# Patient Record
Sex: Female | Born: 1970 | Race: White | Hispanic: No | Marital: Married | Smoking: Never smoker
Health system: Southern US, Community
[De-identification: ages and names within clinical notes are randomized; demographics above are authoritative.]

---

## 1999-04-10 ENCOUNTER — Other Ambulatory Visit: Admission: RE | Admit: 1999-04-10 | Discharge: 1999-04-10 | Payer: Self-pay | Admitting: Obstetrics & Gynecology

## 2000-04-07 ENCOUNTER — Other Ambulatory Visit: Admission: RE | Admit: 2000-04-07 | Discharge: 2000-04-07 | Payer: Self-pay | Admitting: Gynecology

## 2001-04-13 ENCOUNTER — Other Ambulatory Visit: Admission: RE | Admit: 2001-04-13 | Discharge: 2001-04-13 | Payer: Self-pay | Admitting: Gynecology

## 2002-04-22 ENCOUNTER — Other Ambulatory Visit: Admission: RE | Admit: 2002-04-22 | Discharge: 2002-04-22 | Payer: Self-pay | Admitting: Gynecology

## 2002-05-19 ENCOUNTER — Other Ambulatory Visit: Admission: RE | Admit: 2002-05-19 | Discharge: 2002-05-19 | Payer: Self-pay | Admitting: Obstetrics and Gynecology

## 2002-12-10 ENCOUNTER — Inpatient Hospital Stay (HOSPITAL_COMMUNITY): Admission: AD | Admit: 2002-12-10 | Discharge: 2002-12-10 | Payer: Self-pay | Admitting: Obstetrics and Gynecology

## 2002-12-12 ENCOUNTER — Inpatient Hospital Stay (HOSPITAL_COMMUNITY): Admission: AD | Admit: 2002-12-12 | Discharge: 2002-12-15 | Payer: Self-pay | Admitting: *Deleted

## 2003-01-26 ENCOUNTER — Other Ambulatory Visit: Admission: RE | Admit: 2003-01-26 | Discharge: 2003-01-26 | Payer: Self-pay | Admitting: Obstetrics and Gynecology

## 2004-02-14 ENCOUNTER — Other Ambulatory Visit: Admission: RE | Admit: 2004-02-14 | Discharge: 2004-02-14 | Payer: Self-pay | Admitting: Gynecology

## 2005-02-25 ENCOUNTER — Other Ambulatory Visit: Admission: RE | Admit: 2005-02-25 | Discharge: 2005-02-25 | Payer: Self-pay | Admitting: Gynecology

## 2006-03-13 ENCOUNTER — Other Ambulatory Visit: Admission: RE | Admit: 2006-03-13 | Discharge: 2006-03-13 | Payer: Self-pay | Admitting: Gynecology

## 2008-04-14 ENCOUNTER — Encounter: Admission: RE | Admit: 2008-04-14 | Discharge: 2008-04-14 | Payer: Self-pay | Admitting: Gynecology

## 2010-06-01 NOTE — Discharge Summary (Signed)
Kaitlin Vaughn, Kaitlin Vaughn                            ACCOUNT NO.:  [id number]   MEDICAL RECORD NO.:  [id number]                   PATIENT TYPE:  INP   LOCATION:  9106                                 FACILITY:  [facility]   PHYSICIAN:  Duke Salvia. Marcelle Overlie, M.D.            DATE OF BIRTH:  [birth year]   DATE OF ADMISSION:  12/12/2002  DATE OF DISCHARGE:  12/15/2002                                 DISCHARGE SUMMARY   ADMISSION DIAGNOSES:  1. Intrauterine pregnancy at 39-4/7 weeks estimated gestational age.  2. Spontaneous onset of labor.   DISCHARGE DIAGNOSES:  1. Status post low transverse cesarean section secondary to fetal     intolerance to labor.  2. Viable female infant.   PROCEDURE:  Primary low transverse cesarean section.   REASON FOR ADMISSION:  Please see written H&P.   HOSPITAL COURSE:  The patient was a 40 year old married female primigravida  who was admitted to [facility] with spontaneous onset of  labor.  Fetal heart tones were reassuring at 150 to 160s.  Cervix was noted  to be dilated to 4 cm.  The patient was given epidural anesthesia for pain  relief.  Labor continued.  Some question related to fetal deceleration.  Artificial rupture of membranes was performed revealing light meconium  stained fluid.  Scalp electrode was placed and decrease in fetal variability  was noted.  Scalp stimulation was performed and good beat to beat  variability was noted.  Fetus was monitored very closely and after several  more hours of labor with cervix now dilated to 5 cm, 90% effaced, fetal  heart tones again revealed decreased beat to beat variability with some  variable deceleration.  Decision was then made to proceed with a primary low  transverse cesarean section.  The patient was then transferred to the  operating room where epidural was dosed to an adequate surgical level.  A  low transverse incision was made with delivery of a viable female infant  weighing 9 pounds 0 ounces  with Apgars of 8 at one minute and 9 at five  minutes.  The patient did tolerate the procedure well and was taken to the  recovery room in stable condition.   On postoperative day #1, vital signs were stable.  She was afebrile.  Abdomen was soft with good return of bowel function.  Abdominal dressing was  noted to be clean, dry and intact.  Fundus was firm and nontender.  Labs  revealed hemoglobin of 12.2, wbc count of 17.1, platelet count 237,000.   On postoperative day #2, the patient was without complaints.  Vital signs  remained stable.  She was afebrile.  Abdomen soft.  Abdominal dressing had  been removed revealing incision that was clean, dry and intact.  The patient  was ambulating well and tolerating a regular diet without complaints of  nausea and vomiting.   On postoperative day #3, the patient  was doing well.  She was ambulating  without assistance.  Fundus was firm and nontender.  Incision was clean, dry  and intact.  Staples removed and the patient was discharged home.   CONDITION ON DISCHARGE:  Good.   DIET:  Regular as tolerated.   ACTIVITY:  No heavy lifting, no driving x2 weeks, no vaginal entry.   FOLLOW UP:  The patient is to follow up in the office in one to two weeks  for an incision check.  She is to call for temperature greater than 100  degrees, persistent nausea and vomiting, heavy vaginal bleeding, and/or  redness or drainage from the incisional site.   DISCHARGE MEDICATIONS:  1. Percocet 5/325 #30 one p.o. q.4-6h. p.r.n.  2. Motrin 600 mg q.6h. p.r.n.  3. Prenatal vitamins one p.o. daily.  4. Colace one p.o. daily p.r.n.     Julio Sicks, N.P.                        Richard M. Marcelle Overlie, M.D.    CC/MEDQ  D:  02/16/2003  T:  02/16/2003  Job:  [id number]

## 2010-06-01 NOTE — Op Note (Signed)
Kaitlin Vaughn, Kaitlin Vaughn                            ACCOUNT NO.:  [id number]   MEDICAL RECORD NO.:  [id number]                   PATIENT TYPE:  INP   LOCATION:  9106                                 FACILITY:  [facility]   PHYSICIAN:  Tracie Harrier, M.D.              DATE OF BIRTH:  04/08/1970   DATE OF PROCEDURE:  12/14/2002  DATE OF DISCHARGE:                                 OPERATIVE REPORT   PREOPERATIVE DIAGNOSES:  1. Intrauterine pregnancy at term.  2. Fetal intolerance to labor.   POSTOPERATIVE DIAGNOSES:  1. Intrauterine pregnancy at term.  2. Fetal intolerance to labor.   PROCEDURE:  Primary low transverse cesarean section.   SURGEON:  Tracie Harrier, M.D.   ANESTHESIA:  Epidural.   ESTIMATED BLOOD LOSS:  500 mL.   COMPLICATIONS:  None.   FINDINGS:  At [id number] through a low transverse uterine incision, a viable female  infant was delivered from the vertex presentation.  Apgars were 8 and 9.  The baby was a female weighing 9 pounds.   The pelvis was visualized at the time of surgery and noted to be normal.   DESCRIPTION OF PROCEDURE:  The patient was taken to the operating room where  adequate level of epidural anesthetic was administered.  The patient was  placed on the operating table in the left lateral tilt position, the abdomen  was prepped and draped in the usual sterile fashion with Betadine and  sterile drapes.  A Foley catheter had previously been inserted.  The abdomen  was entered through a Pfannenstiel incision and carried down sharply in the  usual fashion.  The peritoneum was atraumatically entered.  The  vesicouterine peritoneum overlying the lower uterine segment was incised and  a bladder flap was bluntly and sharply created over the lower uterine  segment.  A bladder blade was then placed behind the bladder to insure its  protection during the procedure.  The uterus was then entered through a low  transverse incision and carried out laterally using the  operator's fingers.  The membranes were entered with clear fluid noted.  The vertex was elevated  into the incision and delivered promptly and easily at 0839.  There was  light meconium noted which was bulb suctioned thoroughly.  There was no  particulate thick meconium noted.  The cord was doubly clamped and cut and  the baby was handed promptly to the pediatricians.  The baby did well.  Apgars were 8 and 9 at one and five minutes, respectively.   The baby was a female weighing 9 pounds 0 ounces.  The placenta was then  manually extracted intact with three vessel cord without difficulty.  The  interior of the uterus was wiped clean with a wet sponge.  The uterus was  then closed in a two layer fashion.  The first layer, a running interlocking  suture of #1 Vicryl. A  second imbricating suture was placed across the  primary suture line with a running stitch of #1 Vicryl as well.  Good  hemostasis was noted.  Attention was then turned to closure.  The pelvis was  thoroughly irrigated and noted to be hemostatic.  The pelvis was also  normal.   The rectus muscle and anterior peritoneum was closed with a running suture  of #1 Vicryl.  The subfascial areas were hemostatic.  The fascia was closed  with a running suture of 0 PDS.  Subcutaneous tissue was irrigated and made  hemostatic using the Bovie cautery.  The skin reapproximated with staples  and a sterile dressing applied.   Final sponge, needle and instrument counts were correct x3.  There were no  perioperative complications.  The patient did receive an IV antibiotic after  cord clamp.                                               Tracie Harrier, M.D.    REG/MEDQ  D:  12/14/2002  T:  12/14/2002  Job:  [id number]

## 2012-04-03 ENCOUNTER — Other Ambulatory Visit: Payer: Self-pay

## 2012-04-03 DIAGNOSIS — Z1231 Encounter for screening mammogram for malignant neoplasm of breast: Secondary | ICD-10-CM

## 2012-05-11 ENCOUNTER — Ambulatory Visit
Admission: RE | Admit: 2012-05-11 | Discharge: 2012-05-11 | Disposition: A | Discharge status: Home / Self Care | Payer: BC Managed Care – PPO | Source: Ambulatory Visit

## 2012-05-11 DIAGNOSIS — Z1231 Encounter for screening mammogram for malignant neoplasm of breast: Secondary | ICD-10-CM

## 2012-05-12 ENCOUNTER — Other Ambulatory Visit: Payer: Self-pay | Admitting: Obstetrics and Gynecology

## 2012-05-12 DIAGNOSIS — R928 Other abnormal and inconclusive findings on diagnostic imaging of breast: Secondary | ICD-10-CM

## 2012-05-22 ENCOUNTER — Ambulatory Visit
Admission: RE | Admit: 2012-05-22 | Discharge: 2012-05-22 | Disposition: A | Discharge status: Home / Self Care | Payer: BC Managed Care – PPO | Source: Ambulatory Visit | Attending: Obstetrics and Gynecology | Admitting: Obstetrics and Gynecology

## 2012-05-22 DIAGNOSIS — R928 Other abnormal and inconclusive findings on diagnostic imaging of breast: Secondary | ICD-10-CM

## 2012-10-27 ENCOUNTER — Other Ambulatory Visit: Payer: Self-pay | Admitting: Obstetrics and Gynecology

## 2012-10-27 DIAGNOSIS — N6310 Unspecified lump in the right breast, unspecified quadrant: Secondary | ICD-10-CM

## 2012-11-23 ENCOUNTER — Ambulatory Visit
Admission: RE | Admit: 2012-11-23 | Discharge: 2012-11-23 | Disposition: A | Discharge status: Home / Self Care | Payer: Self-pay | Source: Ambulatory Visit | Attending: Obstetrics and Gynecology | Admitting: Obstetrics and Gynecology

## 2012-11-23 DIAGNOSIS — N6310 Unspecified lump in the right breast, unspecified quadrant: Secondary | ICD-10-CM

## 2013-09-02 ENCOUNTER — Other Ambulatory Visit: Payer: Self-pay | Admitting: Obstetrics and Gynecology

## 2013-09-02 DIAGNOSIS — N6310 Unspecified lump in the right breast, unspecified quadrant: Secondary | ICD-10-CM

## 2013-09-08 ENCOUNTER — Ambulatory Visit
Admission: RE | Admit: 2013-09-08 | Discharge: 2013-09-08 | Disposition: A | Discharge status: Home / Self Care | Payer: Commercial Indemnity | Source: Ambulatory Visit | Attending: Obstetrics and Gynecology | Admitting: Obstetrics and Gynecology

## 2013-09-08 DIAGNOSIS — N6310 Unspecified lump in the right breast, unspecified quadrant: Secondary | ICD-10-CM

## 2020-01-05 ENCOUNTER — Encounter (HOSPITAL_COMMUNITY)
Admission: EM | Disposition: A | Discharge status: Home / Self Care | Payer: Self-pay | Source: Home / Self Care | Attending: Emergency Medicine

## 2020-01-05 ENCOUNTER — Other Ambulatory Visit: Payer: Self-pay

## 2020-01-05 ENCOUNTER — Emergency Department (HOSPITAL_COMMUNITY): Payer: Managed Care, Other (non HMO) | Admitting: Anesthesiology

## 2020-01-05 ENCOUNTER — Ambulatory Visit (HOSPITAL_COMMUNITY)
Admission: EM | Admit: 2020-01-05 | Discharge: 2020-01-05 | Disposition: A | Discharge status: Home / Self Care | Payer: Managed Care, Other (non HMO) | Attending: Emergency Medicine | Admitting: Emergency Medicine

## 2020-01-05 ENCOUNTER — Emergency Department (HOSPITAL_COMMUNITY): Payer: Managed Care, Other (non HMO)

## 2020-01-05 ENCOUNTER — Encounter (HOSPITAL_COMMUNITY): Payer: Self-pay | Admitting: Emergency Medicine

## 2020-01-05 DIAGNOSIS — T185XXA Foreign body in anus and rectum, initial encounter: Secondary | ICD-10-CM

## 2020-01-05 DIAGNOSIS — X58XXXA Exposure to other specified factors, initial encounter: Secondary | ICD-10-CM | POA: Insufficient documentation

## 2020-01-05 DIAGNOSIS — Z20822 Contact with and (suspected) exposure to covid-19: Secondary | ICD-10-CM | POA: Insufficient documentation

## 2020-01-05 DIAGNOSIS — Z79899 Other long term (current) drug therapy: Secondary | ICD-10-CM | POA: Insufficient documentation

## 2020-01-05 LAB — URINALYSIS, ROUTINE W REFLEX MICROSCOPIC
Bilirubin Urine: NEGATIVE
Glucose, UA: NEGATIVE mg/dL
Hgb urine dipstick: NEGATIVE
Ketones, ur: 20 mg/dL — AB
Leukocytes,Ua: NEGATIVE
Nitrite: NEGATIVE
Protein, ur: NEGATIVE mg/dL
Specific Gravity, Urine: 1.023 (ref 1.005–1.030)
pH: 6 (ref 5.0–8.0)

## 2020-01-05 LAB — COMPREHENSIVE METABOLIC PANEL
ALT: 15 U/L (ref 0–44)
AST: 16 U/L (ref 15–41)
Albumin: 4.3 g/dL (ref 3.5–5.0)
Alkaline Phosphatase: 59 U/L (ref 38–126)
Anion gap: 10 (ref 5–15)
BUN: 15 mg/dL (ref 6–20)
CO2: 23 mmol/L (ref 22–32)
Calcium: 8.7 mg/dL — ABNORMAL LOW (ref 8.9–10.3)
Chloride: 103 mmol/L (ref 98–111)
Creatinine, Ser: 0.73 mg/dL (ref 0.44–1.00)
GFR, Estimated: >60 mL/min (ref >60)
Glucose, Bld: 99 mg/dL (ref 70–99)
Potassium: 3.5 mmol/L (ref 3.5–5.1)
Sodium: 136 mmol/L (ref 135–145)
Total Bilirubin: 1 mg/dL (ref 0.3–1.2)
Total Protein: 7.3 g/dL (ref 6.5–8.1)

## 2020-01-05 LAB — CBC WITH DIFFERENTIAL/PLATELET
Abs Immature Granulocytes: 0.03 10*3/uL (ref 0.00–0.07)
Basophils Absolute: 0.1 10*3/uL (ref 0.0–0.1)
Basophils Relative: 1 %
Eosinophils Absolute: 0.1 10*3/uL (ref 0.0–0.5)
Eosinophils Relative: 1 %
HCT: 39.3 % (ref 36.0–46.0)
Hemoglobin: 12.6 g/dL (ref 12.0–15.0)
Immature Granulocytes: 0 %
Lymphocytes Relative: 17 %
Lymphs Abs: 1.7 10*3/uL (ref 0.7–4.0)
MCH: 32.1 pg (ref 26.0–34.0)
MCHC: 32.1 g/dL (ref 30.0–36.0)
MCV: 100.3 fL — ABNORMAL HIGH (ref 80.0–100.0)
Monocytes Absolute: 0.5 10*3/uL (ref 0.1–1.0)
Monocytes Relative: 5 %
Neutro Abs: 7.4 10*3/uL (ref 1.7–7.7)
Neutrophils Relative %: 76 %
Platelets: 339 10*3/uL (ref 150–400)
RBC: 3.92 MIL/uL (ref 3.87–5.11)
RDW: 13.3 % (ref 11.5–15.5)
WBC: 9.7 10*3/uL (ref 4.0–10.5)
nRBC: 0 % (ref 0.0–0.2)

## 2020-01-05 LAB — RESP PANEL BY RT-PCR (FLU A&B, COVID) ARPGX2
Influenza A by PCR: NEGATIVE
Influenza B by PCR: NEGATIVE
SARS Coronavirus 2 by RT PCR: NEGATIVE

## 2020-01-05 LAB — PROTIME-INR
INR: 1 (ref 0.8–1.2)
Prothrombin Time: 12.7 seconds (ref 11.4–15.2)

## 2020-01-05 LAB — POC URINE PREG, ED: Preg Test, Ur: NEGATIVE

## 2020-01-05 SURGERY — EXAM UNDER ANESTHESIA
Anesthesia: General | Site: Rectum

## 2020-01-05 MED ORDER — LIDOCAINE 2% (20 MG/ML) 5 ML SYRINGE
INTRAMUSCULAR | Status: DC | PRN
  Administered 2020-01-05: 40 mg via INTRAVENOUS
  Administered 2020-01-05: 60 mg via INTRAVENOUS

## 2020-01-05 MED ORDER — OXYCODONE HCL 5 MG PO TABS: 5.0000 mg | ORAL_TABLET | Freq: Once | ORAL | Status: DC | PRN

## 2020-01-05 MED ORDER — SCOPOLAMINE 1 MG/3DAYS TD PT72
MEDICATED_PATCH | TRANSDERMAL | Status: AC
  Filled 2020-01-05: qty 1

## 2020-01-05 MED ORDER — 0.9 % SODIUM CHLORIDE (POUR BTL) OPTIME
TOPICAL | Status: DC | PRN
  Administered 2020-01-05: 1000 mL

## 2020-01-05 MED ORDER — MIDAZOLAM HCL 2 MG/2ML IJ SOLN
INTRAMUSCULAR | Status: DC | PRN
  Administered 2020-01-05: 2 mg via INTRAVENOUS

## 2020-01-05 MED ORDER — FENTANYL CITRATE (PF) 100 MCG/2ML IJ SOLN
25.0000 ug | INTRAMUSCULAR | Status: DC | PRN
Start: 2020-01-05 — End: 2020-01-06

## 2020-01-05 MED ORDER — FENTANYL CITRATE (PF) 250 MCG/5ML IJ SOLN
INTRAMUSCULAR | Status: AC
Start: 2020-01-05 — End: ?
  Filled 2020-01-05: qty 5

## 2020-01-05 MED ORDER — PROPOFOL 10 MG/ML IV BOLUS
INTRAVENOUS | Status: DC | PRN
  Administered 2020-01-05: 20 mg via INTRAVENOUS
  Administered 2020-01-05: 160 mg via INTRAVENOUS

## 2020-01-05 MED ORDER — ACETAMINOPHEN 10 MG/ML IV SOLN: 1000.0000 mg | Freq: Once | INTRAVENOUS | Status: DC | PRN

## 2020-01-05 MED ORDER — DEXAMETHASONE SODIUM PHOSPHATE 10 MG/ML IJ SOLN
INTRAMUSCULAR | Status: DC | PRN
  Administered 2020-01-05: 10 mg via INTRAVENOUS

## 2020-01-05 MED ORDER — MEPERIDINE HCL 50 MG/ML IJ SOLN: 6.2500 mg | INTRAMUSCULAR | Status: DC | PRN

## 2020-01-05 MED ORDER — OXYCODONE HCL 5 MG/5ML PO SOLN: 5.0000 mg | Freq: Once | ORAL | Status: DC | PRN

## 2020-01-05 MED ORDER — LACTATED RINGERS IV SOLN: INTRAVENOUS | Status: DC

## 2020-01-05 MED ORDER — SCOPOLAMINE 1 MG/3DAYS TD PT72
MEDICATED_PATCH | TRANSDERMAL | Status: DC | PRN
  Administered 2020-01-05: 1 via TRANSDERMAL

## 2020-01-05 MED ORDER — ONDANSETRON HCL 4 MG/2ML IJ SOLN
INTRAMUSCULAR | Status: DC | PRN
  Administered 2020-01-05: 4 mg via INTRAVENOUS

## 2020-01-05 MED ORDER — AMISULPRIDE (ANTIEMETIC) 5 MG/2ML IV SOLN: 10.0000 mg | Freq: Once | INTRAVENOUS | Status: DC | PRN

## 2020-01-05 MED ORDER — SUCCINYLCHOLINE CHLORIDE 200 MG/10ML IV SOSY
PREFILLED_SYRINGE | INTRAVENOUS | Status: DC | PRN
  Administered 2020-01-05: 120 mg via INTRAVENOUS

## 2020-01-05 MED ORDER — FENTANYL CITRATE (PF) 250 MCG/5ML IJ SOLN
INTRAMUSCULAR | Status: DC | PRN
  Administered 2020-01-05 (×2): 50 ug via INTRAVENOUS

## 2020-01-05 MED ORDER — ACETAMINOPHEN 325 MG PO TABS: 325.0000 mg | ORAL_TABLET | Freq: Once | ORAL | Status: DC | PRN

## 2020-01-05 MED ORDER — MIDAZOLAM HCL 2 MG/2ML IJ SOLN
INTRAMUSCULAR | Status: AC
  Filled 2020-01-05: qty 2

## 2020-01-05 MED ORDER — LACTATED RINGERS IV SOLN: INTRAVENOUS | Status: DC | PRN

## 2020-01-05 MED ORDER — ACETAMINOPHEN 160 MG/5ML PO SOLN
325.0000 mg | Freq: Once | ORAL | Status: DC | PRN
Start: 2020-01-05 — End: 2020-01-06

## 2020-01-05 MED ORDER — PROPOFOL 10 MG/ML IV BOLUS
INTRAVENOUS | Status: AC
  Filled 2020-01-05: qty 20

## 2020-01-05 SURGICAL SUPPLY — 22 items
BLADE SURG 15 STRL LF DISP TIS (BLADE) IMPLANT
BLADE SURG 15 STRL SS (BLADE)
CHLORAPREP W/TINT 26 (MISCELLANEOUS) IMPLANT
COVER SURGICAL LIGHT HANDLE (MISCELLANEOUS) ×2 IMPLANT
COVER WAND RF STERILE (DRAPES) IMPLANT
ELECT REM PT RETURN 15FT ADLT (MISCELLANEOUS) IMPLANT
GAUZE 4X4 16PLY RFD (DISPOSABLE) IMPLANT
GLOVE BIOGEL PI IND STRL 7.0 (GLOVE) ×1 IMPLANT
GLOVE BIOGEL PI INDICATOR 7.0 (GLOVE) ×1
GLOVE SURG ORTHO 8.0 STRL STRW (GLOVE) ×2 IMPLANT
GOWN STRL REUS W/TWL LRG LVL3 (GOWN DISPOSABLE) ×2 IMPLANT
GOWN STRL REUS W/TWL XL LVL3 (GOWN DISPOSABLE) ×4 IMPLANT
KIT BASIN OR (CUSTOM PROCEDURE TRAY) IMPLANT
KIT TURNOVER KIT A (KITS) IMPLANT
NEEDLE HYPO 22GX1.5 SAFETY (NEEDLE) IMPLANT
PACK LITHOTOMY IV (CUSTOM PROCEDURE TRAY) ×2 IMPLANT
PENCIL SMOKE EVACUATOR (MISCELLANEOUS) IMPLANT
SURGILUBE 2OZ TUBE FLIPTOP (MISCELLANEOUS) ×2 IMPLANT
SUT CHROMIC 2 0 SH (SUTURE) IMPLANT
SUT CHROMIC 3 0 SH 27 (SUTURE) IMPLANT
SYR CONTROL 10ML LL (SYRINGE) IMPLANT
TOWEL OR 17X26 10 PK STRL BLUE (TOWEL DISPOSABLE) IMPLANT

## 2020-01-05 NOTE — ED Notes (Signed)
Assisted Dr Roslynn Amble while he performed rectal exam.

## 2020-01-05 NOTE — ED Notes (Signed)
Report received from [place], [place].  Assumed role as primary nurse at this time.  Pt resting quietly in bed at this time.  NADN.  Will continue to monitor.

## 2020-01-05 NOTE — Anesthesia Procedure Notes (Signed)
Procedure Name: Intubation Date/Time: 01/05/2020 8:34 PM Performed by: Cynda Familia, CRNA Pre-anesthesia Checklist: Patient identified, Emergency Drugs available, Suction available and Patient being monitored Patient Re-evaluated:Patient Re-evaluated prior to induction Oxygen Delivery Method: Circle System Utilized Preoxygenation: Pre-oxygenation with 100% oxygen Induction Type: IV induction, Rapid sequence and Cricoid Pressure applied Ventilation: Mask ventilation without difficulty Laryngoscope Size: Miller and 2 Grade View: Grade I Tube type: Oral Tube size: 7.0 mm Number of attempts: 1 Airway Equipment and Method: Stylet and Oral airway Placement Confirmation: ETT inserted through vocal cords under direct vision,  positive ETCO2 and breath sounds checked- equal and bilateral Secured at: 21 cm Tube secured with: Tape Dental Injury: Teeth and Oropharynx as per pre-operative assessment  Comments: Smooth Iv induction [name]-- intubation AM CRNA atraumatic-- teeth and mouth as preop -- slight chipping front teeth --unchanged-- bilat BS

## 2020-01-05 NOTE — Anesthesia Preprocedure Evaluation (Addendum)
Anesthesia Evaluation  Patient identified by MRN, date of birth, ID band Patient awake    Reviewed: Allergy & Precautions, NPO status , Patient's Chart, lab work & pertinent test results  Airway Mallampati: II  TM Distance: >3 FB Neck ROM: Full    Dental  (+) Teeth Intact, Dental Advisory Given, Chipped,    Pulmonary neg pulmonary ROS,    breath sounds clear to auscultation       Cardiovascular negative cardio ROS   Rhythm:Regular Rate:Normal     Neuro/Psych negative neurological ROS  negative psych ROS   GI/Hepatic negative GI ROS, Neg liver ROS,   Endo/Other  negative endocrine ROS  Renal/GU negative Renal ROS     Musculoskeletal   Abdominal Normal abdominal exam  (+)   Peds  Hematology negative hematology ROS (+)   Anesthesia Other Findings   Reproductive/Obstetrics                            Anesthesia Physical Anesthesia Plan  ASA: I  Anesthesia Plan: General   Post-op Pain Management:    Induction: Intravenous, Rapid sequence and Cricoid pressure planned  PONV Risk Score and Plan: 4 or greater and Ondansetron, Dexamethasone, Midazolam and Scopolamine patch - Pre-op  Airway Management Planned: Oral ETT  Additional Equipment: None  Intra-op Plan:   Post-operative Plan: Extubation in OR  Informed Consent: I have reviewed the patients History and Physical, chart, labs and discussed the procedure including the risks, benefits and alternatives for the proposed anesthesia with the patient or authorized representative who has indicated his/her understanding and acceptance.       Plan Discussed with: CRNA  Anesthesia Plan Comments:        Anesthesia Quick Evaluation

## 2020-01-05 NOTE — Anesthesia Procedure Notes (Signed)
Date/Time: 01/05/2020 8:51 PM Performed by: Cynda Familia, CRNA Oxygen Delivery Method: Simple face mask Placement Confirmation: positive ETCO2 and breath sounds checked- equal and bilateral Dental Injury: Teeth and Oropharynx as per pre-operative assessment

## 2020-01-05 NOTE — ED Provider Notes (Signed)
[facility] DEPT Provider Note   CSN: [id number] Arrival date & time: 01/05/20  1546     History No chief complaint on file.   Kaitlin Vaughn is a 49 y.o. female.  Patient reports that she placed vibrator per rectum at approximately 12:30 PM this afternoon.  Lost vibrator, unable to retrieve.  Reports this is never happened before.  Not currently having any pain.  Denies any medical problems.  HPI     History reviewed. No pertinent past medical history.  There are no problems to display for this patient.   Past Surgical History:  Procedure Laterality Date  . CESAREAN SECTION       OB History   No obstetric history on file.     History reviewed. No pertinent family history.  Social History   Tobacco Use  . Smoking status: Never Smoker  . Smokeless tobacco: Never Used  Substance Use Topics  . Alcohol use: Yes    Alcohol/week: 2.0 standard drinks    Types: 2 Glasses of wine per week  . Drug use: Not Currently    Home Medications Prior to Admission medications   Medication Sig Start Date End Date Taking? Authorizing Provider  citalopram (CELEXA) 40 MG tablet Take 40 mg by mouth daily. 10/22/19   [provider]  traZODone (DESYREL) 50 MG tablet Take 50 mg by mouth at bedtime. 11/22/19   [provider]    Allergies    Patient has no known allergies.  Review of Systems   Review of Systems  Constitutional: Negative for chills and fever.  HENT: Negative for ear pain and sore throat.   Eyes: Negative for pain and visual disturbance.  Respiratory: Negative for cough and shortness of breath.   Cardiovascular: Negative for chest pain and palpitations.  Gastrointestinal: Negative for abdominal pain and vomiting.       Foreign body  Genitourinary: Negative for dysuria and hematuria.  Musculoskeletal: Negative for arthralgias and back pain.  Skin: Negative for color change and rash.  Neurological: Negative for  seizures and syncope.  All other systems reviewed and are negative.   Physical Exam Updated Vital Signs BP (!) 152/98   Pulse (!) 104   Temp 97.6 F (36.4 C) (Oral)   Resp (!) 22   LMP 12/18/2019   SpO2 96%   Physical Exam Vitals and nursing note reviewed.  Constitutional:      General: She is not in acute distress.    Appearance: She is well-developed and well-nourished.  HENT:     Head: Normocephalic and atraumatic.  Eyes:     Conjunctiva/sclera: Conjunctivae normal.  Cardiovascular:     Rate and Rhythm: Normal rate.     Pulses: Normal pulses.  Pulmonary:     Effort: Pulmonary effort is normal. No respiratory distress.  Abdominal:     General: There is no distension.     Palpations: Abdomen is soft.     Tenderness: There is no abdominal tenderness. There is no guarding or rebound.  Genitourinary:    Comments: RN chaperone, normal-appearing anus, on digital rectal exam, able to palpate foreign body deep in rectum  Under anoscopy, unable to visualize foreign body Musculoskeletal:        General: No edema.     Cervical back: Neck supple.  Skin:    General: Skin is warm and dry.  Neurological:     Mental Status: She is alert.  Psychiatric:        Mood and  Affect: Mood and affect and mood normal.        Behavior: Behavior normal.     ED Results / Procedures / Treatments   Labs (all labs ordered are listed, but only abnormal results are displayed) Labs Reviewed  COMPREHENSIVE METABOLIC PANEL - Abnormal; Notable for the following components:      Result Value   Calcium 8.7 (*)    All other components within normal limits  CBC WITH DIFFERENTIAL/PLATELET - Abnormal; Notable for the following components:   MCV 100.3 (*)    All other components within normal limits  URINALYSIS, ROUTINE W REFLEX MICROSCOPIC - Abnormal; Notable for the following components:   Ketones, ur 20 (*)    All other components within normal limits  RESP PANEL BY RT-PCR (FLU A&B, COVID)  ARPGX2  PROTIME-INR  POC URINE PREG, ED    EKG None  Radiology DG Abd Acute W/Chest  Result Date: 01/05/2020 CLINICAL DATA:  49 year old female with foreign object in the rectum. EXAM: DG ABDOMEN ACUTE WITH 1 VIEW CHEST COMPARISON:  None. FINDINGS: The lungs are clear. There is no pleural effusion or pneumothorax. The cardiac silhouette is within limits. Radiopaque foreign object noted over the rectum measuring approximately 7.5 cm in length. There is no bowel dilatation or evidence of obstruction. No free air or radiopaque calculi. The osseous structures and soft tissues are unremarkable. IMPRESSION: 1. Radiopaque foreign object over the rectum. 2. No acute cardiopulmonary process. Electronically Signed   By: Anner Crete M.D.   On: 01/05/2020 18:38    Procedures Procedures (including critical care time)  Medications Ordered in ED Medications - No data to display  ED Course  I have reviewed the triage vital signs and the nursing notes.  Pertinent labs & imaging results that were available during my care of the patient were reviewed by me and considered in my medical decision making (see chart for details).    MDM Rules/Calculators/A&P                         49 year old lady presents to ER with concern for foreign body of rectum.  Plain films confirm radiopaque foreign object over rectum 7.5 cm in length.  On digital rectal exam I was able to palpate at very tip of my finger the foreign body.  Subsequently used anoscope to attempt to directly visualize so that this could be removed with forceps.  Unfortunately I was unable to directly visualize foreign body.  Consequently, called Dr. Harlow Asa.  He will take patient to OR to perform foreign body removal under general anesthesia.  Final Clinical Impression(s) / ED Diagnoses Final diagnoses:  Foreign body in anus, initial encounter    Rx / DC Orders ED Discharge Orders    None       Lucrezia Starch, MD 01/05/20 1949

## 2020-01-05 NOTE — Transfer of Care (Signed)
Immediate Anesthesia Transfer of Care Note  Patient: Kaitlin Vaughn  Procedure(s) Performed: EXAM UNDER ANESTHESIA,REMOVAL OF FOREIGN BODY (N/A )  Patient Location: PACU  Anesthesia Type:General  Level of Consciousness: awake and alert   Airway & Oxygen Therapy: Patient Spontanous Breathing and Patient connected to face mask oxygen  Post-op Assessment: Report given to RN and Post -op Vital signs reviewed and stable  Post vital signs: Reviewed and stable  Last Vitals:  Vitals Value Taken Time  BP    Temp    Pulse 110 01/05/20 2058  Resp    SpO2 100 % 01/05/20 2058  Vitals shown include unvalidated device data.  Last Pain:  Vitals:   01/05/20 1759  TempSrc: Oral  PainSc:          Complications: No complications documented.

## 2020-01-05 NOTE — ED Triage Notes (Signed)
Patient states she has a vibrator stuck in her rectum since around 12:30 today, about as long and thick as her index finger. Last meal was 01/05/20 around 8pm. States it is no longer vibrating.

## 2020-01-05 NOTE — ED Notes (Signed)
Patient transported to X-ray 

## 2020-01-05 NOTE — ED Notes (Signed)
Report given to OR.  Pt SBAR information covered and IV started in Emergency Department.  Pt anxious about procedure.  NADN.

## 2020-01-05 NOTE — H&P (Signed)
Kaitlin Vaughn is an 49 y.o. female.    General Surgery [facility]  Chief Complaint: foreign body in rectum  HPI: Patient is a 49 year old female who inserted a vibrator into the rectum approximately noon today.  Unfortunately this was unable to be retrieved.  She has seen a small amount of bleeding with bowel movement.  She presented to the emergency department for evaluation.  The device was palpable on rectal examination but could not be removed successfully.  General surgery was called for management.  Patient has had previous cesarean section.  No prior abdominal surgery otherwise.  No prior anorectal surgery.  History reviewed. No pertinent past medical history.  Past Surgical History:  Procedure Laterality Date  . CESAREAN SECTION      History reviewed. No pertinent family history. Social History:  reports that she has never smoked. She has never used smokeless tobacco. She reports current alcohol use of about 2.0 standard drinks of alcohol per week. She reports previous drug use.  Allergies: No Known Allergies  Medications Prior to Admission  Medication Sig Dispense Refill  . citalopram (CELEXA) 40 MG tablet Take 40 mg by mouth daily.    . traZODone (DESYREL) 50 MG tablet Take 50 mg by mouth at bedtime.      Results for orders placed or performed during the hospital encounter of 01/05/20 (from the past 48 hour(s))  Comprehensive metabolic panel     Status: Abnormal   Collection Time: 01/05/20  4:20 PM  Result Value Ref Range   Sodium 136 135 - 145 mmol/L   Potassium 3.5 3.5 - 5.1 mmol/L   Chloride 103 98 - 111 mmol/L   CO2 23 22 - 32 mmol/L   Glucose, Bld 99 70 - 99 mg/dL    Comment: Glucose reference range applies only to samples taken after fasting for at least 8 hours.   BUN 15 6 - 20 mg/dL   Creatinine, Ser 0.73 0.44 - 1.00 mg/dL   Calcium 8.7 (L) 8.9 - 10.3 mg/dL   Total Protein 7.3 6.5 - 8.1 g/dL   Albumin 4.3 3.5 - 5.0 g/dL   AST 16 15 -  41 U/L   ALT 15 0 - 44 U/L   Alkaline Phosphatase 59 38 - 126 U/L   Total Bilirubin 1.0 0.3 - 1.2 mg/dL   GFR, Estimated >60 >60 mL/min    Comment: (NOTE) Calculated using the CKD-EPI Creatinine Equation (2021)    Anion gap 10 5 - 15    Comment: Performed at [facility], [place] [address], [place], [place] [address]  CBC with Differential     Status: Abnormal   Collection Time: 01/05/20  4:20 PM  Result Value Ref Range   WBC 9.7 4.0 - 10.5 K/uL   RBC 3.92 3.87 - 5.11 MIL/uL   Hemoglobin 12.6 12.0 - 15.0 g/dL   HCT 39.3 36.0 - 46.0 %   MCV 100.3 (H) 80.0 - 100.0 fL   MCH 32.1 26.0 - 34.0 pg   MCHC 32.1 30.0 - 36.0 g/dL   RDW 13.3 11.5 - 15.5 %   Platelets 339 150 - 400 K/uL   nRBC 0.0 0.0 - 0.2 %   Neutrophils Relative % 76 %   Neutro Abs 7.4 1.7 - 7.7 K/uL   Lymphocytes Relative 17 %   Lymphs Abs 1.7 0.7 - 4.0 K/uL   Monocytes Relative 5 %   Monocytes Absolute 0.5 0.1 - 1.0 K/uL   Eosinophils Relative  1 %   Eosinophils Absolute 0.1 0.0 - 0.5 K/uL   Basophils Relative 1 %   Basophils Absolute 0.1 0.0 - 0.1 K/uL   Immature Granulocytes 0 %   Abs Immature Granulocytes 0.03 0.00 - 0.07 K/uL    Comment: Performed at [facility], [place] [address], [place], [place] [address]  Protime-INR     Status: None   Collection Time: 01/05/20  4:20 PM  Result Value Ref Range   Prothrombin Time 12.7 11.4 - 15.2 seconds   INR 1.0 0.8 - 1.2    Comment: (NOTE) INR goal varies based on device and disease states. Performed at [facility], [place] [address] [place], [place] [address]   Urinalysis, Routine w reflex microscopic     Status: Abnormal   Collection Time: 01/05/20  5:34 PM  Result Value Ref Range   Color, Urine YELLOW YELLOW   APPearance CLEAR CLEAR   Specific Gravity, Urine 1.023 1.005 - 1.030   pH 6.0 5.0 - 8.0   Glucose, UA NEGATIVE NEGATIVE mg/dL   Hgb urine dipstick NEGATIVE NEGATIVE   Bilirubin Urine NEGATIVE NEGATIVE    Ketones, ur 20 (A) NEGATIVE mg/dL   Protein, ur NEGATIVE NEGATIVE mg/dL   Nitrite NEGATIVE NEGATIVE   Leukocytes,Ua NEGATIVE NEGATIVE    Comment: Performed at [place] [address], [place],  [address]  POC Urine Pregnancy, ED (not at [facility])     Status: None   Collection Time: 01/05/20  5:52 PM  Result Value Ref Range   Preg Test, Ur NEGATIVE NEGATIVE    Comment:        THE SENSITIVITY OF THIS METHODOLOGY IS >24 mIU/mL   Resp Panel by RT-PCR (Flu A&B, Covid) Nasopharyngeal Swab     Status: None   Collection Time: 01/05/20  5:58 PM   Specimen: Nasopharyngeal Swab; Nasopharyngeal(NP) swabs in vial transport medium  Result Value Ref Range   SARS Coronavirus 2 by RT PCR NEGATIVE NEGATIVE    Comment: (NOTE) SARS-CoV-2 target nucleic acids are NOT DETECTED.  The SARS-CoV-2 RNA is generally detectable in upper respiratory specimens during the acute phase of infection. The lowest concentration of SARS-CoV-2 viral copies this assay can detect is 138 copies/mL. A negative result does not preclude SARS-Cov-2 infection and should not be used as the sole basis for treatment or other patient management decisions. A negative result may occur with  improper specimen collection/handling, submission of specimen other than nasopharyngeal swab, presence of viral mutation(s) within the areas targeted by this assay, and inadequate number of viral copies(<138 copies/mL). A negative result must be combined with clinical observations, patient history, and epidemiological information. The expected result is Negative.  Fact Sheet for Patients:  EntrepreneurPulse.com.au  Fact Sheet for Healthcare Providers:  IncredibleEmployment.be  This test is no t yet approved or cleared by the [place] FDA and  has been authorized for detection and/or diagnosis of SARS-CoV-2 by FDA under an Emergency Use Authorization (EUA). This EUA will  remain  in effect (meaning this test can be used) for the duration of the COVID-19 declaration under Section 564(b)(1) of the Act, 21 U.S.C.section 360bbb-3(b)(1), unless the authorization is terminated  or revoked sooner.       Influenza A by PCR NEGATIVE NEGATIVE   Influenza B by PCR NEGATIVE NEGATIVE    Comment: (NOTE) The Xpert Xpress SARS-CoV-2/FLU/RSV plus assay is intended as an aid in the diagnosis of influenza from Nasopharyngeal swab specimens and should not  be used as a sole basis for treatment. Nasal washings and aspirates are unacceptable for Xpert Xpress SARS-CoV-2/FLU/RSV testing.  Fact Sheet for Patients: EntrepreneurPulse.com.au  Fact Sheet for Healthcare Providers: IncredibleEmployment.be  This test is not yet approved or cleared by the [place] FDA and has been authorized for detection and/or diagnosis of SARS-CoV-2 by FDA under an Emergency Use Authorization (EUA). This EUA will remain in effect (meaning this test can be used) for the duration of the COVID-19 declaration under Section 564(b)(1) of the Act, 21 U.S.C. section 360bbb-3(b)(1), unless the authorization is terminated or revoked.  Performed at [facility], [place] [address], [place], [place] [address]    DG Abd Acute W/Chest  Result Date: 01/05/2020 CLINICAL DATA:  49 year old female with foreign object in the rectum. EXAM: DG ABDOMEN ACUTE WITH 1 VIEW CHEST COMPARISON:  None. FINDINGS: The lungs are clear. There is no pleural effusion or pneumothorax. The cardiac silhouette is within limits. Radiopaque foreign object noted over the rectum measuring approximately 7.5 cm in length. There is no bowel dilatation or evidence of obstruction. No free air or radiopaque calculi. The osseous structures and soft tissues are unremarkable. IMPRESSION: 1. Radiopaque foreign object over the rectum. 2. No acute cardiopulmonary process. Electronically Signed    By: Anner Crete M.D.   On: 01/05/2020 18:38    Review of Systems  Constitutional: Negative.   HENT: Negative.   Eyes: Negative.   Respiratory: Negative.   Cardiovascular: Negative.   Gastrointestinal: Positive for anal bleeding and rectal pain.  Endocrine: Negative.   Genitourinary: Negative.   Musculoskeletal: Negative.   Skin: Negative.   Allergic/Immunologic: Negative.   Neurological: Negative.   Hematological: Negative.   Psychiatric/Behavioral: The patient is nervous/anxious.     Physical Exam   Blood pressure (!) 152/98, pulse (!) 104, temperature 97.6 F (36.4 C), temperature source Oral, resp. rate (!) 22, last menstrual period 12/18/2019, SpO2 96 %.  CONSTITUTIONAL: no acute distress; conversant; no obvious deformities  EYES: conjunctiva moist; no lid lag; anicteric; pupils equal bilaterally  NECK: trachea midline; no thyroid nodularity  LUNGS: respiratory effort normal & unlabored; no wheeze; no rales; no tactile fremitus  CV: rate and rhythm regular; no palpable thrills; no murmur; no edema bilat lower extremities  GI: abdomen is soft without distension; non-tender; no hepatosplenomegaly; no obvious hernia  MSK: normal range of motion of extremities; no clubbing; no cyanosis  PSYCH: appropriate affect for situation; alert and oriented to person, place, & time  LYMPHATIC: no palpable cervical lymphadenopathy; no evidence lymphedema in extremities   Assessment/Plan Foreign body in rectum  Plan exam under anesthesia, extraction of foreign body from rectum  Discussed procedure with patient.  Discussed possible need for more extensive surgery if injury identified.  The risks and benefits of the procedure have been discussed at length with the patient.  The patient understands the proposed procedure, potential alternative treatments, and the course of recovery to be expected.  All of the patient's questions have been answered at this time.  The  patient wishes to proceed with surgery.  Armandina Gemma, MD [facility] Office: [place], [place] 01/05/2020, 8:28 PM

## 2020-01-05 NOTE — Op Note (Signed)
Operative Note  Pre-operative Diagnosis:  Foreign body in rectum   Post-operative Diagnosis:  same  Surgeon:  Armandina Gemma, MD  Assistant:  none   Procedure:  Exam under anesthesia, extraction of foreign body from rectum  Anesthesia:  General   Estimated Blood Loss:  none  Drains: none         Specimen: none; foreign body disposed of at patient request  Indications: Patient is a 49 year old female who placed a vibrator in the rectum at approximately 12:30 PM today.  She has been unable to retrieve.  Patient was evaluated in the emergency department without successful removal.  Patient is now sent to general surgery for examination under anesthesia and removal of foreign body from the rectum.  Procedure:  The patient was seen in the pre-op holding area. The risks, benefits, complications, treatment options, and expected outcomes were previously discussed with the patient. The patient agreed with the proposed plan and has signed the informed consent form.  The patient was brought to the operating room by the surgical team, identified as Kaitlin Vaughn and the procedure verified. A "time out" was completed and the above information confirmed.  Patient had been placed in lithotomy and prepped and draped in usual aseptic fashion.  Digital rectal examination is performed.  There is no sign of external trauma.  Sphincter mechanism appears to be intact.  Foreign body is palpable in the mid rectum.  It has a flange located inferiorly.  A ring forcep was introduced through the anal canal and the device is gently grasped.  With gentle traction, the device is brought into the anal canal and completely removed from the rectum.  It appears to be intact.  There is no sign of bleeding.  Palpation after removal shows no palpable abnormality.  Patient is awakened from anesthesia and transported to the recovery room.  The patient tolerated the procedure well.   Armandina Gemma, MD [facility] Office: [phone]

## 2020-01-06 NOTE — Anesthesia Postprocedure Evaluation (Signed)
Anesthesia Post Note  Patient: Kaitlin Vaughn  Procedure(s) Performed: EXAM UNDER ANESTHESIA,REMOVAL OF FOREIGN BODY (N/A Rectum)     Patient location during evaluation: PACU Anesthesia Type: General Level of consciousness: awake and alert Pain management: pain level controlled Vital Signs Assessment: post-procedure vital signs reviewed and stable Respiratory status: spontaneous breathing, nonlabored ventilation, respiratory function stable and patient connected to nasal cannula oxygen Cardiovascular status: blood pressure returned to baseline and stable Postop Assessment: no apparent nausea or vomiting Anesthetic complications: no   No complications documented.  Last Vitals:  Vitals:   01/05/20 2115 01/05/20 2130  BP: 133/86 (!) 132/92  Pulse: 90 99  Resp: 16 16  Temp:  37 C  SpO2: 100% 100%    Last Pain:  Vitals:   01/05/20 2130  TempSrc:   PainSc: 0-No pain                 Effie Berkshire

## 2022-05-22 IMAGING — CR DG ABDOMEN ACUTE W/ 1V CHEST
3 series · 3 of 3 positions shown · non-contrast
Comparison: None.

CLINICAL DATA: 49-year-old female with foreign object in the
rectum.

EXAM:
DG ABDOMEN ACUTE WITH 1 VIEW CHEST

[w chest pa]
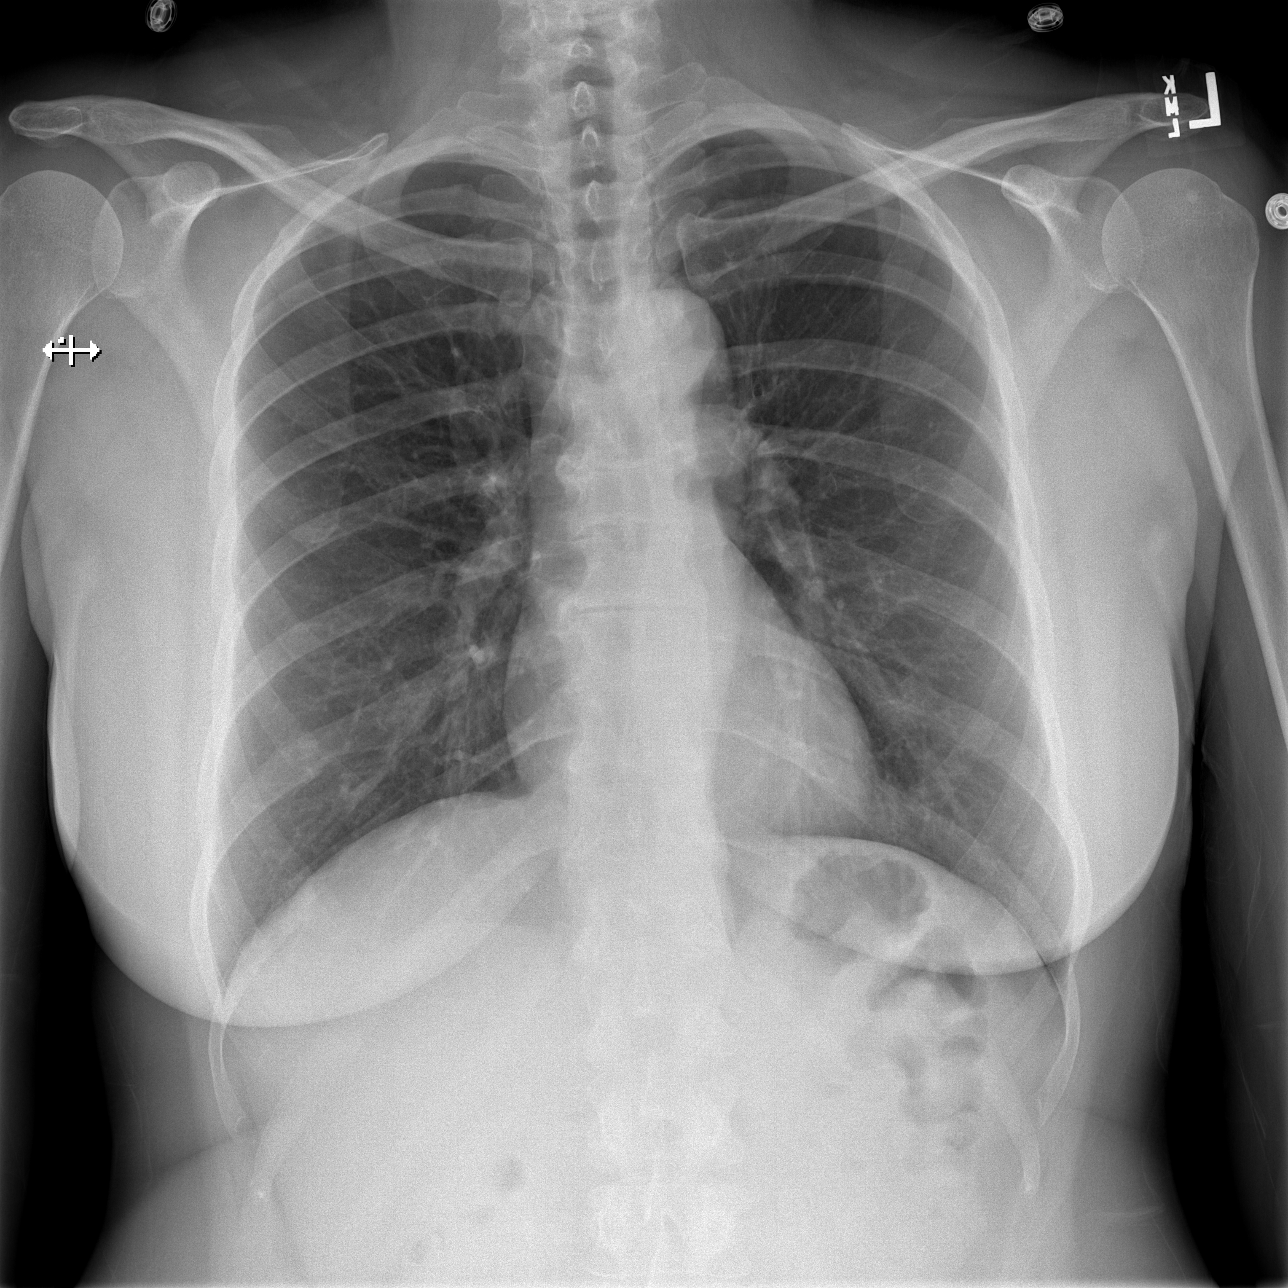

[w abdomen upright]
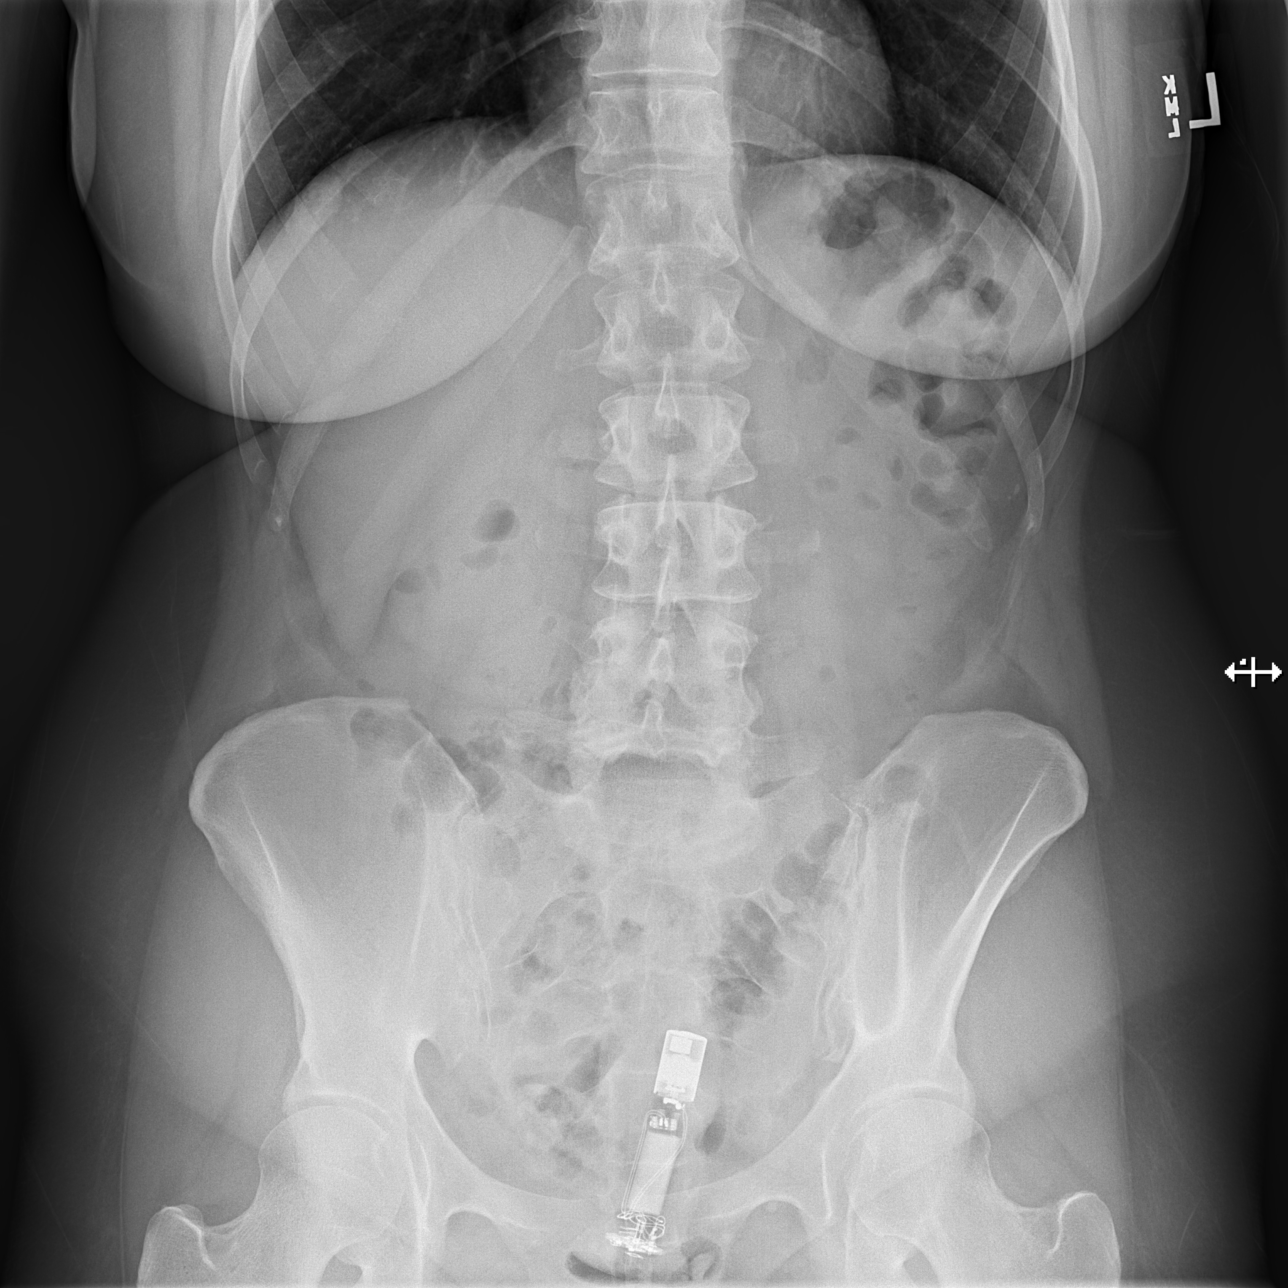

[t abdomen supine]
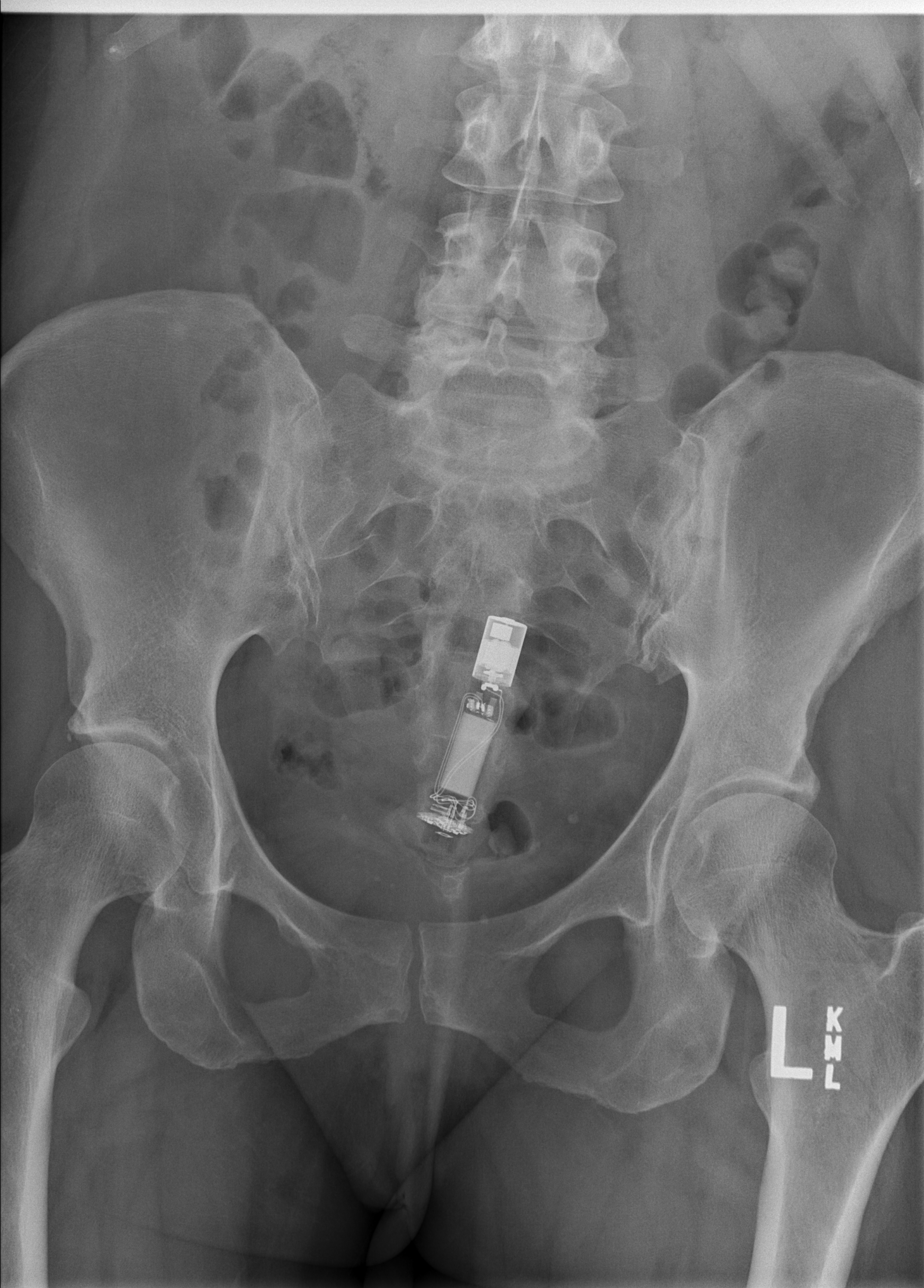

[3 of 3 positions shown; findings below may reference images not displayed]

FINDINGS: The lungs are clear. There is no pleural effusion or pneumothorax.
The cardiac silhouette is within limits.

Radiopaque foreign object noted over the rectum measuring
approximately 7.5 cm in length. There is no bowel dilatation or
evidence of obstruction. No free air or radiopaque calculi. The
osseous structures and soft tissues are unremarkable.
IMPRESSION: 1. Radiopaque foreign object over the rectum.
2. No acute cardiopulmonary process.
